# Patient Record
Sex: Male | Born: 1990 | Race: White | Hispanic: No | Marital: Single | Smoking: Current every day smoker
Health system: Southern US, Community
[De-identification: ages and names within clinical notes are randomized; demographics above are authoritative.]

## PROBLEM LIST (undated history)

## (undated) HISTORY — PX: OTHER SURGICAL HISTORY: SHX169

---
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
## 2011-07-03 ENCOUNTER — Other Ambulatory Visit: Payer: Self-pay | Admitting: Physician Assistant

## 2011-07-03 MED ORDER — ALUM & MAG HYDROXIDE-SIMETH 200-200-20 MG/5ML PO SUSP: 30.0000 mL | ORAL | Status: DC | PRN

## 2011-07-03 MED ORDER — SODIUM CHLORIDE 0.9 % IV SOLN: INTRAVENOUS | Status: DC

## 2011-08-24 ENCOUNTER — Encounter: Payer: Self-pay | Admitting: Emergency Medicine

## 2011-08-24 ENCOUNTER — Emergency Department (HOSPITAL_COMMUNITY): Payer: BC Managed Care – PPO

## 2011-08-24 ENCOUNTER — Emergency Department (HOSPITAL_COMMUNITY)
Admission: EM | Admit: 2011-08-24 | Discharge: 2011-08-24 | Disposition: A | Discharge status: Home / Self Care | Payer: BC Managed Care – PPO | Attending: Emergency Medicine | Admitting: Emergency Medicine

## 2011-08-24 DIAGNOSIS — W108XXA Fall (on) (from) other stairs and steps, initial encounter: Secondary | ICD-10-CM | POA: Insufficient documentation

## 2011-08-24 DIAGNOSIS — F17200 Nicotine dependence, unspecified, uncomplicated: Secondary | ICD-10-CM | POA: Insufficient documentation

## 2011-08-24 DIAGNOSIS — R51 Headache: Secondary | ICD-10-CM | POA: Insufficient documentation

## 2011-08-24 DIAGNOSIS — S0990XA Unspecified injury of head, initial encounter: Secondary | ICD-10-CM | POA: Insufficient documentation

## 2011-08-24 DIAGNOSIS — S01309A Unspecified open wound of unspecified ear, initial encounter: Secondary | ICD-10-CM | POA: Insufficient documentation

## 2011-08-24 DIAGNOSIS — S01311A Laceration without foreign body of right ear, initial encounter: Secondary | ICD-10-CM

## 2011-08-24 MED ORDER — BACITRACIN 500 UNIT/GM EX OINT
1.0000 "application " | TOPICAL_OINTMENT | Freq: Two times a day (BID) | CUTANEOUS | Status: DC
  Administered 2011-08-24: 1 via TOPICAL

## 2011-08-24 MED ORDER — HYDROCODONE-ACETAMINOPHEN 5-325 MG PO TABS: 1.0000 | ORAL_TABLET | Freq: Four times a day (QID) | ORAL | Status: AC | PRN

## 2011-08-24 MED ORDER — HYDROCODONE-ACETAMINOPHEN 5-325 MG PO TABS
1.0000 | ORAL_TABLET | Freq: Once | ORAL | Status: AC
  Administered 2011-08-24: 1 via ORAL
  Filled 2011-08-24: qty 1

## 2011-08-24 MED ORDER — HYDROMORPHONE HCL PF 2 MG/ML IJ SOLN
2.0000 mg | Freq: Once | INTRAMUSCULAR | Status: AC
  Administered 2011-08-24: 2 mg via INTRAMUSCULAR
  Filled 2011-08-24: qty 1

## 2011-08-24 NOTE — ED Notes (Signed)
Suture cart at bedside 

## 2011-08-24 NOTE — ED Provider Notes (Signed)
Medical screening examination/treatment/procedure(s) were performed by non-physician practitioner and as supervising physician I was immediately available for consultation/collaboration.   Taia Bramlett A. Patrica Duel, MD 08/24/11 1400

## 2011-08-24 NOTE — ED Notes (Signed)
PT. MISSED A STEP AT STAIRS AT HOME THIS MORNING AND FELL , NO LOC , PRESENTS WITH RIGHT OUTER EAR LACERATION WITH MODERATE BLEEDING .

## 2011-08-24 NOTE — ED Provider Notes (Signed)
History     CSN: [id number]  Arrival date & time 08/24/11  4098   First MD Initiated Contact with Patient 08/24/11 0703      Chief Complaint  Patient presents with  . Fall    (Consider location/radiation/quality/duration/timing/severity/associated sxs/prior treatment) HPI Comments: Patient reports he was walking down the stairs in the dark, missed the bottom step and fell against a wall, hitting his right ear on the wall and splitting it open.  Reports headache.  Denies any other injury or pain.  Denies LOC, focal neurological deficits.  Reports last tetanus vx was 1.5 years ago.    Patient is a 20 y.o. male presenting with fall. The history is provided by the patient and a parent.  Fall    History reviewed. No pertinent past medical history.  Past Surgical History  Procedure Date  . Arm surgery     No family history on file.  History  Substance Use Topics  . Smoking status: Current Everyday Smoker  . Smokeless tobacco: Not on file  . Alcohol Use: No      Review of Systems  All other systems reviewed and are negative.    Allergies  Review of patient's allergies indicates no known allergies.  Home Medications  No current outpatient prescriptions on file.  BP 138/86  Pulse 106  Temp(Src) 98 F (36.7 C) (Oral)  Resp 18  SpO2 97%  Physical Exam  Nursing note and vitals reviewed. Constitutional: He is oriented to person, place, and time. He appears well-developed and well-nourished.  HENT:  Head: Normocephalic. Head is with laceration.    Neck: Neck supple.  Pulmonary/Chest: Effort normal.  Neurological: He is alert and oriented to person, place, and time. He has normal strength. No cranial nerve deficit.       No pronator drift  Psychiatric: He has a normal mood and affect. His behavior is normal. Thought content normal.    ED Course  Procedures (including critical care time)  LACERATION REPAIR Performed by: Rise Patience Consent: Verbal  consent obtained. Risks and benefits: risks, benefits and alternatives were discussed Patient identity confirmed: provided demographic data Time out performed prior to procedure Prepped and Draped in normal sterile fashion Wound explored  Laceration Location: right ear  Laceration Length: 4cm  No Foreign Bodies seen or palpated  Anesthesia: local infiltration and regional block  Local anesthetic: lidocaine 2% no epinephrine  Anesthetic total: 8 ml  Irrigation method: syringe Amount of cleaning: standard  Skin closure: prolene 4-0   Number of sutures or staples: 13  Technique: simple interrupted   Patient tolerance: Patient tolerated the procedure well with no immediate complications.   Labs Reviewed - No data to display Ct Head Wo Contrast  08/24/2011  *RADIOLOGY REPORT*  Clinical Data: Fall against wall.  Right ear trauma.  Mastoid region tender.  CT HEAD WITHOUT CONTRAST  Technique:  Contiguous axial images were obtained from the base of the skull through the vertex without contrast.  Comparison: Head CT 12/30/2006  Findings: No acute intracranial abnormality is identified. Specifically, there is no hemorrhage, hydrocephalus, mass effect, mass lesion, or evidence of acute infarction.  The imaged paranasal sinuses are clear.  The mastoid air cells are completely imaged and are clear bilaterally.  The middle ears are clear.  The skull appears intact.  Specifically, no acute abnormality of the skull is identified in the right occipital or mastoid region.  No evidence of scalp hematoma or scalp laceration.  IMPRESSION:  1.  No  acute intracranial abnormality. 2.  The skull is intact and the mastoid air cells are clear.  Original Report Authenticated By: Britta Mccreedy, M.D.     1. Head injury   2. Laceration of right ear, external       MDM  Healthy 20 year old with fall and minor head injury, through and through laceration to right ear, repaired in ED.  Discussed wound care,  follow up, reasons for immediate return.  Pt verbalizes understanding.         Rise Patience, [place] 08/24/11 1027

## 2011-08-24 NOTE — ED Notes (Signed)
Family at bedside. 

## 2011-08-24 NOTE — ED Notes (Signed)
MD at bedside for repair of right ear laceration.

## 2014-08-23 ENCOUNTER — Other Ambulatory Visit: Payer: Self-pay | Admitting: Orthopedic Surgery

## 2014-08-23 DIAGNOSIS — M545 Low back pain, unspecified: Secondary | ICD-10-CM

## 2014-08-25 ENCOUNTER — Ambulatory Visit
Admission: RE | Admit: 2014-08-25 | Discharge: 2014-08-25 | Disposition: A | Discharge status: Home / Self Care | Payer: BC Managed Care – PPO | Source: Ambulatory Visit | Attending: Orthopedic Surgery | Admitting: Orthopedic Surgery

## 2014-08-25 ENCOUNTER — Other Ambulatory Visit: Payer: Self-pay | Admitting: Orthopedic Surgery

## 2014-08-25 DIAGNOSIS — M545 Low back pain, unspecified: Secondary | ICD-10-CM

## 2014-08-25 MED ORDER — METHYLPREDNISOLONE ACETATE 40 MG/ML INJ SUSP (RADIOLOG
120.0000 mg | Freq: Once | INTRAMUSCULAR | Status: AC
  Administered 2014-08-25: 120 mg via EPIDURAL

## 2014-08-25 MED ORDER — IOHEXOL 180 MG/ML  SOLN
1.0000 mL | Freq: Once | INTRAMUSCULAR | Status: AC | PRN
  Administered 2014-08-25: 1 mL via INTRATHECAL

## 2014-08-25 NOTE — Discharge Instructions (Signed)

## 2014-11-09 DIAGNOSIS — S069XAA Unspecified intracranial injury with loss of consciousness status unknown, initial encounter: Secondary | ICD-10-CM | POA: Insufficient documentation

## 2014-11-09 DIAGNOSIS — S060X0A Concussion without loss of consciousness, initial encounter: Secondary | ICD-10-CM | POA: Insufficient documentation

## 2014-11-09 DIAGNOSIS — S069X9A Unspecified intracranial injury with loss of consciousness of unspecified duration, initial encounter: Secondary | ICD-10-CM | POA: Insufficient documentation

## 2015-03-09 ENCOUNTER — Other Ambulatory Visit: Payer: Self-pay | Admitting: Orthopedic Surgery

## 2015-03-09 DIAGNOSIS — M545 Low back pain, unspecified: Secondary | ICD-10-CM

## 2015-03-09 DIAGNOSIS — G8929 Other chronic pain: Secondary | ICD-10-CM

## 2015-03-14 ENCOUNTER — Other Ambulatory Visit: Payer: Self-pay | Admitting: Orthopedic Surgery

## 2015-03-14 ENCOUNTER — Ambulatory Visit
Admission: RE | Admit: 2015-03-14 | Discharge: 2015-03-14 | Disposition: A | Discharge status: Home / Self Care | Payer: BC Managed Care – PPO | Source: Ambulatory Visit | Attending: Orthopedic Surgery | Admitting: Orthopedic Surgery

## 2015-03-14 DIAGNOSIS — G8929 Other chronic pain: Secondary | ICD-10-CM

## 2015-03-14 DIAGNOSIS — M545 Low back pain, unspecified: Secondary | ICD-10-CM

## 2015-03-14 MED ORDER — METHYLPREDNISOLONE ACETATE 40 MG/ML INJ SUSP (RADIOLOG: 120.0000 mg | Freq: Once | INTRAMUSCULAR | Status: DC

## 2015-03-14 MED ORDER — IOHEXOL 180 MG/ML  SOLN: 1.0000 mL | Freq: Once | INTRAMUSCULAR | Status: AC | PRN

## 2015-03-14 NOTE — Discharge Instructions (Signed)

## 2018-07-20 NOTE — Telephone Encounter (Signed)
This encounter was created in error - please disregard.

## 2019-12-16 ENCOUNTER — Other Ambulatory Visit: Payer: Self-pay | Admitting: Critical Care Medicine

## 2019-12-16 MED ORDER — HYDROXYZINE HCL 25 MG PO TABS: 25.0000 mg | ORAL_TABLET | Freq: Three times a day (TID) | ORAL | 0 refills | Status: AC | PRN

## 2019-12-16 NOTE — Progress Notes (Signed)
Refill on visteril
